# Patient Record
Sex: Male | Born: 1985
Health system: Southern US, Community
[De-identification: ages and names within clinical notes are randomized; demographics above are authoritative.]

---

## 2011-08-31 ENCOUNTER — Encounter (HOSPITAL_COMMUNITY): Payer: Self-pay | Admitting: *Deleted

## 2011-08-31 ENCOUNTER — Emergency Department (HOSPITAL_COMMUNITY)
Admission: EM | Admit: 2011-08-31 | Discharge: 2011-09-01 | Discharge status: Against Medical Advice | Payer: BC Managed Care – PPO | Attending: Emergency Medicine | Admitting: Emergency Medicine

## 2011-08-31 DIAGNOSIS — J392 Other diseases of pharynx: Secondary | ICD-10-CM

## 2011-08-31 DIAGNOSIS — R6889 Other general symptoms and signs: Secondary | ICD-10-CM | POA: Insufficient documentation

## 2011-08-31 NOTE — ED Notes (Signed)
The pt feels like there is something in the back of his throat.  It feels like a hair and he cannot get it out for 45 minutes

## 2011-09-01 MED ORDER — GI COCKTAIL ~~LOC~~
30.0000 mL | Freq: Once | ORAL | Status: DC
  Filled 2011-09-01: qty 30

## 2011-09-01 NOTE — ED Notes (Signed)
erprovider aware that patient has left the department

## 2011-09-01 NOTE — ED Notes (Signed)
Returned to give patient his medication,  He was no longer in his Actor.  The nurse out front reports seeing the patient and his friend walk out.  The patient did not talk to any staff before leaving.

## 2011-09-01 NOTE — ED Provider Notes (Signed)
Medical screening examination/treatment/procedure(s) were performed by non-physician practitioner and as supervising physician I was immediately available for consultation/collaboration.  Sunnie Nielsen, MD 09/01/11 [phone]

## 2011-09-01 NOTE — ED Provider Notes (Signed)
History     CSN: [id number]  Arrival date & time 08/31/11  2332   First MD Initiated Contact with Patient 09/01/11 0117      Chief Complaint  Patient presents with  . something on his throat     (Consider location/radiation/quality/duration/timing/severity/associated sxs/prior treatment) HPI Comments: Patient here with the sensation that he has something stuck in the right back of his throat - he thinks that he may have swallowed a hair and has been unable to get it out - denies eating something with bones or hard, reports no pain just irritation in the throat - no fever, chills, cough or congestion.  Patient is a 26 y.o. male presenting with foreign body. The history is provided by the patient. No language interpreter was used.  Foreign Body  The current episode started 1 to 2 hours ago. The foreign body is suspected to be swallowed. The foreign body is Osei unknown object. The incident was reported. The incident was witnessed/reported by the patient. Pertinent negatives include no chest pain, no fever, no abdominal pain, no vomiting, no congestion, no drainage, no drooling, no hearing loss, no nosebleeds, no sore throat, no trouble swallowing, no choking, no cough and no difficulty breathing. He has been behaving normally. His past medical history does not include prior foreign body removal, esophageal disease or pica. There were no sick contacts. He has received no recent medical care.    History reviewed. No pertinent past medical history.  History reviewed. No pertinent past surgical history.  No family history on file.  History  Substance Use Topics  . Smoking status: Never Smoker   . Smokeless tobacco: Not on file  . Alcohol Use: No      Review of Systems  Constitutional: Negative for fever.  HENT: Negative for nosebleeds, congestion, sore throat, drooling and trouble swallowing.   Respiratory: Negative for cough and choking.   Cardiovascular: Negative for chest pain.    Gastrointestinal: Negative for vomiting and abdominal pain.  All other systems reviewed and are negative.    Allergies  Review of patient's allergies indicates no known allergies.  Home Medications  No current outpatient prescriptions on file.  BP 126/81  Pulse 58  Temp(Src) 97.5 F (36.4 C) (Oral)  Resp 18  SpO2 98%  Physical Exam  Nursing note and vitals reviewed. Constitutional: He is oriented to person, place, and time. He appears well-developed and well-nourished. No distress.  HENT:  Head: Normocephalic and atraumatic.  Right Ear: External ear normal.  Left Ear: External ear normal.  Nose: Nose normal.  Mouth/Throat: Oropharynx is clear and moist. No oropharyngeal exudate.       No evidence of laceration or foreign body  Eyes: Conjunctivae are normal. Pupils are equal, round, and reactive to light. No scleral icterus.  Neck: Normal range of motion. Neck supple. No JVD present. No tracheal deviation present.  Cardiovascular: Normal rate, regular rhythm and normal heart sounds.  Exam reveals no gallop and no friction rub.   No murmur heard. Pulmonary/Chest: Effort normal and breath sounds normal. No stridor. No respiratory distress. He has no wheezes. He has no rales. He exhibits no tenderness.  Abdominal: Soft. Bowel sounds are normal. He exhibits no distension. There is no tenderness.  Musculoskeletal: Normal range of motion. He exhibits no edema and no tenderness.  Lymphadenopathy:    He has no cervical adenopathy.  Neurological: He is alert and oriented to person, place, and time. No cranial nerve deficit. He exhibits normal muscle tone.  Coordination normal.  Skin: Skin is warm and dry. No rash noted. No erythema. No pallor.  Psychiatric: He has a normal mood and affect. His behavior is normal. Judgment and thought content normal.    ED Course  Procedures (including critical care time)  Labs Reviewed - No data to display No results found.   Throat  irritation   MDM  Patient here with the sensation that there is something in his throat - this was not directly visualized with tongue depressor and light - I had planned to give the patient GI cocktail but was informed by the nurse that he left after seeing me.        Izola Price [place], [place] 09/01/11 [phone]

## 2012-01-20 ENCOUNTER — Ambulatory Visit: Payer: Self-pay | Admitting: Family Medicine

## 2012-01-20 LAB — DOT URINE DIP: Glucose,UR: NEGATIVE mg/dL (ref 0–75)

## 2013-07-06 ENCOUNTER — Ambulatory Visit: Payer: Self-pay | Admitting: Family Medicine

## 2014-02-18 ENCOUNTER — Ambulatory Visit: Payer: Self-pay | Admitting: Physician Assistant

## 2014-02-18 LAB — DOT URINE DIP
BLOOD: NEGATIVE
Glucose,UR: NEGATIVE
Protein: NEGATIVE
SPECIFIC GRAVITY: 1.025 (ref 1.000–1.030)

## 2014-07-08 ENCOUNTER — Ambulatory Visit
Admit: 2014-07-08 | Disposition: A | Discharge status: Home / Self Care | Payer: Self-pay | Attending: Family Medicine | Admitting: Family Medicine

## 2015-03-26 IMAGING — CR DG CHEST 1V
1 series · 1 of 1 positions shown · non-contrast
Comparison: None.

CLINICAL DATA: + ppd

EXAM:
CHEST - 1 VIEW

[w chest pa]
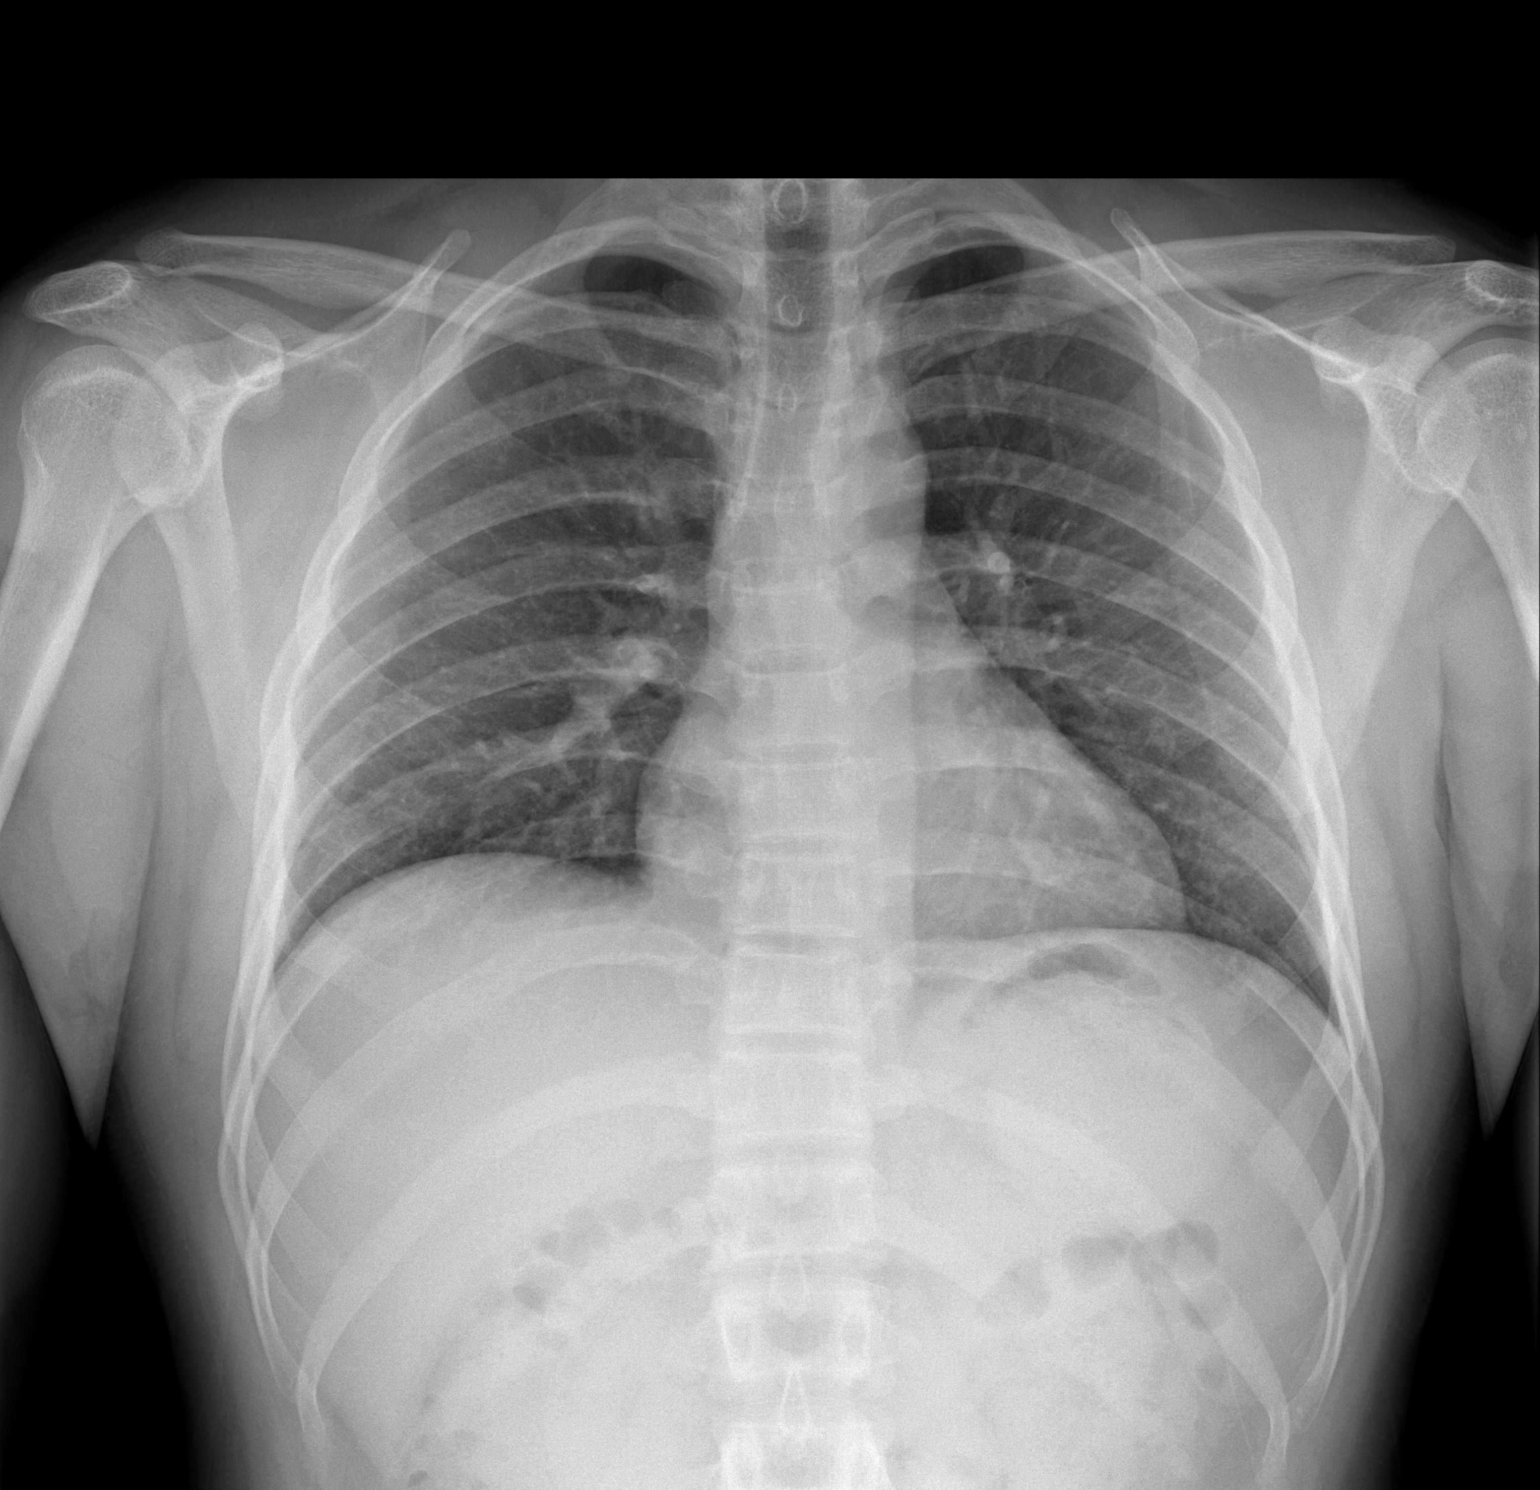

[1 of 1 positions shown; findings below may reference images not displayed]

FINDINGS: The heart size and mediastinal contours are within normal limits.
Both lungs are clear. The visualized skeletal structures are
unremarkable.
IMPRESSION: No active disease.

## 2016-02-03 ENCOUNTER — Encounter: Payer: Self-pay | Admitting: Unknown Physician Specialty

## 2016-02-03 ENCOUNTER — Ambulatory Visit (INDEPENDENT_AMBULATORY_CARE_PROVIDER_SITE_OTHER): Payer: Self-pay | Admitting: Unknown Physician Specialty

## 2016-02-03 VITALS — BP 139/86 | HR 61 | Temp 98.3°F | Ht 67.0 in | Wt 176.4 lb

## 2016-02-03 DIAGNOSIS — Z0000 Encounter for general adult medical examination without abnormal findings: Secondary | ICD-10-CM

## 2016-02-03 DIAGNOSIS — Z024 Encounter for examination for driving license: Secondary | ICD-10-CM

## 2016-02-03 LAB — URINALYSIS, DIPSTICK ONLY
BILIRUBIN UA: NEGATIVE
GLUCOSE, UA: NEGATIVE
Ketones, UA: NEGATIVE
Leukocytes, UA: NEGATIVE
Nitrite, UA: NEGATIVE
PROTEIN UA: NEGATIVE
RBC UA: NEGATIVE
SPEC GRAV UA: 1.025 (ref 1.005–1.030)
Urobilinogen, Ur: 0.2 mg/dL (ref 0.2–1.0)
pH, UA: 6 (ref 5.0–7.5)

## 2016-07-30 ENCOUNTER — Ambulatory Visit (HOSPITAL_COMMUNITY)
Admission: EM | Admit: 2016-07-30 | Discharge: 2016-07-30 | Disposition: A | Discharge status: Home / Self Care | Payer: Self-pay | Attending: Family Medicine | Admitting: Family Medicine

## 2016-07-30 ENCOUNTER — Encounter (HOSPITAL_COMMUNITY): Payer: Self-pay

## 2016-07-30 DIAGNOSIS — J301 Allergic rhinitis due to pollen: Secondary | ICD-10-CM

## 2016-07-30 MED ORDER — IPRATROPIUM BROMIDE 0.06 % NA SOLN: 2.0000 | Freq: Four times a day (QID) | NASAL | 0 refills | Status: DC

## 2016-07-30 MED ORDER — METHYLPREDNISOLONE 4 MG PO TBPK: ORAL_TABLET | ORAL | 0 refills | Status: DC

## 2016-07-30 NOTE — ED Triage Notes (Signed)
Sore throat, runny nose, itchy eyes, sneezing, watery eyes, hot spells for 3 weeks. Has been taking claritin, zaditor and sudafed congesion.

## 2016-07-30 NOTE — ED Provider Notes (Signed)
CSN: [id number]     Arrival date & time 07/30/16  1446 History   None    Chief Complaint  Patient presents with  . Nasal Congestion   (Consider location/radiation/quality/duration/timing/severity/associated sxs/prior Treatment) Patient c/o allergies    The history is provided by the patient.  Allergic Reaction  Severity:  Mild Relieved by:  Nothing Worsened by:  Nothing Ineffective treatments:  None tried URI  Presenting symptoms: congestion, fatigue and rhinorrhea   Severity:  Moderate Duration:  2 days Timing:  Constant Chronicity:  New Relieved by:  Nothing Worsened by:  Nothing Ineffective treatments:  None tried   History reviewed. No pertinent past medical history. History reviewed. No pertinent surgical history. No family history on file. Social History  Substance Use Topics  . Smoking status: Never Smoker  . Smokeless tobacco: Never Used  . Alcohol use No    Review of Systems  Constitutional: Positive for fatigue.  HENT: Positive for congestion and rhinorrhea.   Eyes: Negative.   Respiratory: Negative.   Cardiovascular: Negative.   Gastrointestinal: Negative.   Endocrine: Negative.   Genitourinary: Negative.   Musculoskeletal: Negative.   Allergic/Immunologic: Negative.   Neurological: Negative.   Hematological: Negative.   Psychiatric/Behavioral: Negative.     Allergies  Patient has no known allergies.  Home Medications   Prior to Admission medications   Medication Sig Start Date End Date Taking? Authorizing Provider  ipratropium (ATROVENT) 0.06 % nasal spray Place 2 sprays into both nostrils 4 (four) times daily. 07/30/16   Deatra CanterWilliam J Laquinton Bihm, FNP  methylPREDNISolone (MEDROL DOSEPAK) 4 MG TBPK tablet Take 6-5-4-3-2-1 po qd 07/30/16   Deatra CanterWilliam J Mikinzie Maciejewski, FNP   Meds Ordered and Administered this Visit  Medications - No data to display  BP 138/86 (BP Location: Left Arm)   Pulse 67   Temp 98.1 F (36.7 C) (Oral)   Resp 20   SpO2 98%  No data  found.   Physical Exam  Constitutional: He is oriented to person, place, and time. He appears well-developed and well-nourished.  HENT:  Head: Normocephalic and atraumatic.  Right Ear: External ear normal.  Left Ear: External ear normal.  Mouth/Throat: Oropharynx is clear and moist.  Eyes: Conjunctivae and EOM are normal. Pupils are equal, round, and reactive to light.  Neck: Normal range of motion. Neck supple.  Cardiovascular: Normal rate, regular rhythm and normal heart sounds.   Pulmonary/Chest: Effort normal and breath sounds normal.  Neurological: He is alert and oriented to person, place, and time.  Nursing note and vitals reviewed.   Urgent Care Course     Procedures (including critical care time)  Labs Review Labs Reviewed - No data to display  Imaging Review No results found.   Visual Acuity Review  Right Eye Distance:   Left Eye Distance:   Bilateral Distance:    Right Eye Near:   Left Eye Near:    Bilateral Near:         MDM   1. Seasonal allergic rhinitis due to pollen    Atrovent nasal spray Medrol dose pack as directed  Push po fluids, rest, tylenol and motrin otc prn as directed for fever, arthralgias, and myalgias.  Follow up prn if sx's continue or persist.    Deatra CanterWilliam J Jobani Sabado, FNP 07/30/16 1626

## 2016-09-23 NOTE — Progress Notes (Signed)
   BP 139/86 (BP Location: Left Arm, Patient Position: Sitting, Cuff Size: Large)   Pulse 61   Temp 98.3 F (36.8 C)   Ht 5\' 7"  (1.702 m)   Wt 176 lb 6.4 oz (80 kg)   SpO2 97%   BMI 27.63 kg/m    Subjective:    Patient ID: Billy Mcclain, male    DOB: 07/19/1985, 31 y.o.   MRN: [id number]  HPI: Billy Mcclain is a 31 y.o. male  Chief Complaint  Patient presents with  . DOT Physical   See form Relevant past medical, surgical, family and social history reviewed and updated as indicated. Interim medical history since our last visit reviewed. Allergies and medications reviewed and updated.  Review of Systems  Per HPI unless specifically indicated above     Objective:    BP 139/86 (BP Location: Left Arm, Patient Position: Sitting, Cuff Size: Large)   Pulse 61   Temp 98.3 F (36.8 C)   Ht 5\' 7"  (1.702 m)   Wt 176 lb 6.4 oz (80 kg)   SpO2 97%   BMI 27.63 kg/m   Wt Readings from Last 3 Encounters:  02/03/16 176 lb 6.4 oz (80 kg)    Physical Exam  Results for orders placed or performed in visit on 02/03/16  Urinalysis, dipstick only  Result Value Ref Range   Specific Gravity, UA 1.025 1.005 - 1.030   pH, UA 6.0 5.0 - 7.5   Color, UA Yellow Yellow   Appearance Ur Clear Clear   Leukocytes, UA Negative Negative   Protein, UA Negative Negative/Trace   Glucose, UA Negative Negative   Ketones, UA Negative Negative   RBC, UA Negative Negative   Bilirubin, UA Negative Negative   Urobilinogen, Ur 0.2 0.2 - 1.0 mg/dL   Nitrite, UA Negative Negative      Assessment & Plan:   Problem List Items Addressed This Visit    None    Visit Diagnoses    Routine general medical examination at a health care facility    -  Primary   Relevant Orders   Urinalysis, dipstick only (Completed)   Encounter for commercial driver medical examination (CDME)          See form  Follow up plan: No Follow-up on file.

## 2017-07-28 ENCOUNTER — Encounter (HOSPITAL_COMMUNITY): Payer: Self-pay | Admitting: Emergency Medicine

## 2017-07-28 ENCOUNTER — Other Ambulatory Visit: Payer: Self-pay

## 2017-07-28 ENCOUNTER — Ambulatory Visit (HOSPITAL_COMMUNITY)
Admission: EM | Admit: 2017-07-28 | Discharge: 2017-07-28 | Disposition: A | Discharge status: Home / Self Care | Payer: 59 | Attending: Family Medicine | Admitting: Family Medicine

## 2017-07-28 DIAGNOSIS — J302 Other seasonal allergic rhinitis: Secondary | ICD-10-CM

## 2017-07-28 MED ORDER — METHYLPREDNISOLONE ACETATE 80 MG/ML IJ SUSP
80.0000 mg | Freq: Once | INTRAMUSCULAR | Status: AC
  Administered 2017-07-28: 80 mg via INTRAMUSCULAR

## 2017-07-28 MED ORDER — METHYLPREDNISOLONE ACETATE 80 MG/ML IJ SUSP
INTRAMUSCULAR | Status: AC
  Filled 2017-07-28: qty 1

## 2017-07-28 NOTE — ED Triage Notes (Signed)
Sore throat in the morning, sneezing, sniffles, coughing.  Onset of symptoms was saturday

## 2017-07-28 NOTE — ED Provider Notes (Signed)
[facility]    CSN: [id number] Arrival date & time: 07/28/17  1821     History   Chief Complaint Chief Complaint  Patient presents with  . Allergies    HPI Billy Mcclain is a 32 y.o. male.   Seasonal symptoms of sneezing itchy eyes sore throat cough.  Daughter at home has strep throat but patient has not had any fever or headache.  Has not taken any OTC medicine  HPI  History reviewed. No pertinent past medical history.  There are no active problems to display for this patient.   History reviewed. No pertinent surgical history.     Home Medications    Prior to Admission medications   Medication Sig Start Date End Date Taking? Authorizing Provider  ipratropium (ATROVENT) 0.06 % nasal spray Place 2 sprays into both nostrils 4 (four) times daily. 07/30/16   Deatra Canter, FNP  methylPREDNISolone (MEDROL DOSEPAK) 4 MG TBPK tablet Take 6-5-4-3-2-1 po qd 07/30/16   Deatra Canter, FNP    Family History Family History  Problem Relation Age of Onset  . Diabetes Mother   . Hypertension Father     Social History Social History   Tobacco Use  . Smoking status: Never Smoker  . Smokeless tobacco: Never Used  Substance Use Topics  . Alcohol use: No  . Drug use: No     Allergies   Patient has no known allergies.   Review of Systems Review of Systems  Constitutional: Negative.   HENT: Positive for congestion, postnasal drip and sneezing.   Respiratory: Positive for cough.   Cardiovascular: Negative.      Physical Exam Triage Vital Signs ED Triage Vitals  Enc Vitals Group     BP 07/28/17 1857 (!) 140/92     Pulse Rate 07/28/17 1857 74     Resp 07/28/17 1857 18     Temp 07/28/17 1857 98.4 F (36.9 C)     Temp Source 07/28/17 1857 Oral     SpO2 07/28/17 1857 97 %     Weight --      Height --      Head Circumference --      Peak Flow --      Pain Score 07/28/17 1855 8     Pain Loc --      Pain Edu? --      Excl. in GC? --    No data  found.  Updated Vital Signs BP (!) 140/92 (BP Location: Left Arm)   Pulse 74   Temp 98.4 F (36.9 C) (Oral)   Resp 18   SpO2 97%   Visual Acuity Right Eye Distance:   Left Eye Distance:   Bilateral Distance:    Right Eye Near:   Left Eye Near:    Bilateral Near:     Physical Exam  Constitutional: He appears well-developed and well-nourished.  HENT:  Nose: Nose normal.  Mouth/Throat: Oropharynx is clear and moist.  Eyes: Pupils are equal, round, and reactive to light. Conjunctivae are normal.  Cardiovascular: Normal rate and regular rhythm.  Pulmonary/Chest: Effort normal and breath sounds normal.     UC Treatments / Results  Labs (all labs ordered are listed, but only abnormal results are displayed) Labs Reviewed - No data to display  EKG None  Radiology No results found.  Procedures Procedures (including critical care time)  Medications Ordered in UC Medications - No data to display  Initial Impression / Assessment and Plan / UC  Course  I have reviewed the triage vital signs and the nursing notes.  Pertinent labs & imaging results that were available during my care of the patient were reviewed by me and considered in my medical decision making (see chart for details).     Seasonal allergic allergic rhinitis.  We will plan combination antihistamine steroid nasal spray and steroid injection Final Clinical Impressions(s) / UC Diagnoses   Final diagnoses:  None   Discharge Instructions   None    ED Prescriptions    None     Controlled Substance Prescriptions [place] Controlled Substance Registry consulted? No   Frederica Kuster, MD 07/28/17 [phone]

## 2018-02-14 DIAGNOSIS — R030 Elevated blood-pressure reading, without diagnosis of hypertension: Secondary | ICD-10-CM | POA: Diagnosis not present

## 2018-02-14 DIAGNOSIS — E669 Obesity, unspecified: Secondary | ICD-10-CM | POA: Diagnosis not present

## 2018-02-14 DIAGNOSIS — M545 Low back pain: Secondary | ICD-10-CM | POA: Diagnosis not present

## 2018-06-15 DIAGNOSIS — M2560 Stiffness of unspecified joint, not elsewhere classified: Secondary | ICD-10-CM | POA: Diagnosis not present

## 2018-06-15 DIAGNOSIS — M545 Low back pain: Secondary | ICD-10-CM | POA: Diagnosis not present

## 2018-06-15 DIAGNOSIS — R293 Abnormal posture: Secondary | ICD-10-CM | POA: Diagnosis not present

## 2018-06-28 DIAGNOSIS — M545 Low back pain: Secondary | ICD-10-CM | POA: Diagnosis not present

## 2018-06-28 DIAGNOSIS — R293 Abnormal posture: Secondary | ICD-10-CM | POA: Diagnosis not present

## 2018-06-28 DIAGNOSIS — M2560 Stiffness of unspecified joint, not elsewhere classified: Secondary | ICD-10-CM | POA: Diagnosis not present

## 2018-06-29 DIAGNOSIS — R293 Abnormal posture: Secondary | ICD-10-CM | POA: Diagnosis not present

## 2018-06-29 DIAGNOSIS — M2560 Stiffness of unspecified joint, not elsewhere classified: Secondary | ICD-10-CM | POA: Diagnosis not present

## 2018-06-29 DIAGNOSIS — M545 Low back pain: Secondary | ICD-10-CM | POA: Diagnosis not present

## 2018-07-05 DIAGNOSIS — M2560 Stiffness of unspecified joint, not elsewhere classified: Secondary | ICD-10-CM | POA: Diagnosis not present

## 2018-07-05 DIAGNOSIS — M545 Low back pain: Secondary | ICD-10-CM | POA: Diagnosis not present

## 2018-07-05 DIAGNOSIS — R293 Abnormal posture: Secondary | ICD-10-CM | POA: Diagnosis not present

## 2018-07-06 DIAGNOSIS — M545 Low back pain: Secondary | ICD-10-CM | POA: Diagnosis not present

## 2018-07-06 DIAGNOSIS — M2560 Stiffness of unspecified joint, not elsewhere classified: Secondary | ICD-10-CM | POA: Diagnosis not present

## 2018-07-06 DIAGNOSIS — R293 Abnormal posture: Secondary | ICD-10-CM | POA: Diagnosis not present

## 2018-07-07 DIAGNOSIS — R293 Abnormal posture: Secondary | ICD-10-CM | POA: Diagnosis not present

## 2018-07-07 DIAGNOSIS — M545 Low back pain: Secondary | ICD-10-CM | POA: Diagnosis not present

## 2018-07-07 DIAGNOSIS — M2560 Stiffness of unspecified joint, not elsewhere classified: Secondary | ICD-10-CM | POA: Diagnosis not present

## 2018-07-10 DIAGNOSIS — R293 Abnormal posture: Secondary | ICD-10-CM | POA: Diagnosis not present

## 2018-07-10 DIAGNOSIS — M545 Low back pain: Secondary | ICD-10-CM | POA: Diagnosis not present

## 2018-07-10 DIAGNOSIS — M2560 Stiffness of unspecified joint, not elsewhere classified: Secondary | ICD-10-CM | POA: Diagnosis not present

## 2018-07-12 ENCOUNTER — Other Ambulatory Visit: Payer: Self-pay

## 2018-07-12 ENCOUNTER — Encounter (HOSPITAL_COMMUNITY): Payer: Self-pay | Admitting: Emergency Medicine

## 2018-07-12 ENCOUNTER — Emergency Department (HOSPITAL_COMMUNITY)
Admission: EM | Admit: 2018-07-12 | Discharge: 2018-07-12 | Disposition: A | Discharge status: Home / Self Care | Payer: 59 | Attending: Emergency Medicine | Admitting: Emergency Medicine

## 2018-07-12 ENCOUNTER — Ambulatory Visit (HOSPITAL_COMMUNITY)
Admission: EM | Admit: 2018-07-12 | Discharge: 2018-07-12 | Disposition: A | Discharge status: Home / Self Care | Payer: 59 | Source: Home / Self Care

## 2018-07-12 ENCOUNTER — Emergency Department (HOSPITAL_COMMUNITY): Payer: 59

## 2018-07-12 DIAGNOSIS — J069 Acute upper respiratory infection, unspecified: Secondary | ICD-10-CM | POA: Insufficient documentation

## 2018-07-12 DIAGNOSIS — Z20822 Contact with and (suspected) exposure to covid-19: Secondary | ICD-10-CM

## 2018-07-12 DIAGNOSIS — Z79899 Other long term (current) drug therapy: Secondary | ICD-10-CM | POA: Insufficient documentation

## 2018-07-12 DIAGNOSIS — R509 Fever, unspecified: Secondary | ICD-10-CM | POA: Diagnosis present

## 2018-07-12 DIAGNOSIS — Z03818 Encounter for observation for suspected exposure to other biological agents ruled out: Secondary | ICD-10-CM | POA: Diagnosis not present

## 2018-07-12 DIAGNOSIS — Z20828 Contact with and (suspected) exposure to other viral communicable diseases: Secondary | ICD-10-CM | POA: Insufficient documentation

## 2018-07-12 DIAGNOSIS — R6889 Other general symptoms and signs: Secondary | ICD-10-CM

## 2018-07-12 DIAGNOSIS — R05 Cough: Secondary | ICD-10-CM | POA: Diagnosis not present

## 2018-07-12 MED ORDER — ALBUTEROL SULFATE HFA 108 (90 BASE) MCG/ACT IN AERS
2.0000 | INHALATION_SPRAY | Freq: Once | RESPIRATORY_TRACT | Status: AC
  Administered 2018-07-12: 2 via RESPIRATORY_TRACT
  Filled 2018-07-12: qty 6.7

## 2018-07-12 MED ORDER — ACETAMINOPHEN 325 MG PO TABS
650.0000 mg | ORAL_TABLET | Freq: Once | ORAL | Status: AC
  Administered 2018-07-12: 650 mg via ORAL
  Filled 2018-07-12: qty 2

## 2018-07-12 MED ORDER — AEROCHAMBER Z-STAT PLUS/MEDIUM MISC
1.0000 | Freq: Once | Status: AC
  Administered 2018-07-12: 1
  Filled 2018-07-12: qty 1

## 2018-07-12 NOTE — Discharge Instructions (Addendum)
Your chest x-ray is clear.  Your symptoms are likely due to a virus, this may be caused by the coronavirus.  Please continue to treat your symptoms supportively at home, you may take Tylenol every 6 hours for fever and pain, you may use albuterol inhaler as needed for shortness of breath, over-the-counter medication such as Zyrtec or Flonase to help with nasal congestion.  Please make sure you are drinking plenty of fluids and getting rest.  It is recommended that you stay home and quarantine yourself, avoid going into public or exposing any of your other family members.  Return to the emergency department, you you have worsening shortness of breath or difficulty breathing.

## 2018-07-12 NOTE — ED Provider Notes (Signed)
[facility]-EMERGENCY DEPT Provider Note   CSN: [id number] Arrival date & time: 07/12/18  1621    History   Chief Complaint Chief Complaint  Patient presents with  . Fever  . Generalized Body Aches  . Chills  . Shortness of Breath    HPI Billy Mcclain is a 33 y.o. male.     Billy Mcclain is a 33 y.o. male who is otherwise healthy, presents to the emergency department for evaluation of 3 to 4 days of persistent fevers, chills, body aches.  Today he also developed Aubert intermittent cough and started feeling short of breath when he would lay down.  He denies any associated chest pain.  He has had some mild nasal congestion, denies sore throat.  He denies any abdominal pain, nausea vomiting or diarrhea.  Denies any known sick contacts or recent travel.  Reports he has been staying home primarily aside from going out to get supplies intermittently.  He has not taken anything for his symptoms aside from 1 dose of ibuprofen yesterday, no other aggravating or alleviating factors.     History reviewed. No pertinent past medical history.  There are no active problems to display for this patient.   History reviewed. No pertinent surgical history.      Home Medications    Prior to Admission medications   Medication Sig Start Date End Date Taking? Authorizing Provider  ipratropium (ATROVENT) 0.06 % nasal spray Place 2 sprays into both nostrils 4 (four) times daily. 07/30/16   Deatra Canterxford, William J, FNP  methylPREDNISolone (MEDROL DOSEPAK) 4 MG TBPK tablet Take 6-5-4-3-2-1 po qd 07/30/16   Deatra Canterxford, William J, FNP    Family History Family History  Problem Relation Age of Onset  . Diabetes Mother   . Hypertension Father     Social History Social History   Tobacco Use  . Smoking status: Never Smoker  . Smokeless tobacco: Never Used  Substance Use Topics  . Alcohol use: No  . Drug use: No     Allergies   Patient has no known allergies.   Review of Systems  Review of Systems   Physical Exam Updated Vital Signs BP (!) 143/102 (BP Location: Left Arm)   Pulse (!) 111   Temp (!) 102.2 F (39 C) (Oral)   Resp 18   SpO2 99%   Physical Exam Vitals signs and nursing note reviewed.  Constitutional:      General: He is not in acute distress.    Appearance: Normal appearance. He is well-developed and normal weight. He is not ill-appearing, toxic-appearing or diaphoretic.  HENT:     Head: Normocephalic and atraumatic.     Nose:     Comments: Bilateral nares patent with moderate mucosal edema and clear rhinorrhea present.     Mouth/Throat:     Mouth: Mucous membranes are moist.     Pharynx: Oropharynx is clear.     Comments: Posterior oropharynx clear and mucous membranes moist, there is mild erythema but no edema or tonsillar exudates, uvula midline, normal phonation, no trismus, tolerating secretions without difficulty. Eyes:     General:        Right eye: No discharge.        Left eye: No discharge.  Neck:     Musculoskeletal: Neck supple.  Cardiovascular:     Rate and Rhythm: Regular rhythm. Tachycardia present.     Heart sounds: Normal heart sounds. No murmur. No friction rub. No gallop.   Pulmonary:  Effort: Pulmonary effort is normal. No respiratory distress.     Breath sounds: Normal breath sounds.     Comments: Respirations equal and unlabored, patient able to speak in full sentences, lungs clear to auscultation bilaterally, slightly diminished lung sounds in the bases Abdominal:     General: Abdomen is flat. Bowel sounds are normal. There is no distension.     Palpations: Abdomen is soft. There is no mass.     Tenderness: There is no abdominal tenderness. There is no guarding.     Comments: Abdomen soft, nondistended, nontender to palpation in all quadrants without guarding or peritoneal signs  Skin:    General: Skin is warm and dry.  Neurological:     Mental Status: He is alert and oriented to person, place, and time.      Coordination: Coordination normal.  Psychiatric:        Mood and Affect: Mood normal.        Behavior: Behavior normal.      ED Treatments / Results  Labs (all labs ordered are listed, but only abnormal results are displayed) Labs Reviewed - No data to display  EKG None  Radiology Dg Chest [facility] 1 View  Result Date: 07/12/2018 CLINICAL DATA:  Cough, fever EXAM: PORTABLE CHEST 1 VIEW COMPARISON:  None. FINDINGS: The heart size and mediastinal contours are within normal limits. Both lungs are clear. The visualized skeletal structures are unremarkable. IMPRESSION: No active disease. Electronically Signed   By: Elige Ko   On: 07/12/2018 18:26    Procedures Procedures (including critical care time)  Medications Ordered in ED Medications  acetaminophen (TYLENOL) tablet 650 mg (650 mg Oral Given 07/12/18 1825)  albuterol (PROVENTIL HFA;VENTOLIN HFA) 108 (90 Base) MCG/ACT inhaler 2 puff (2 puffs Inhalation Given 07/12/18 1826)  aerochamber Z-Stat Plus/medium 1 each (1 each Other Given 07/12/18 1826)     Initial Impression / Assessment and Plan / ED Course  I have reviewed the triage vital signs and the nursing notes.  Pertinent labs & imaging results that were available during my care of the patient were reviewed by me and considered in my medical decision making (see chart for details).  33 year old male presents with 3 days of fevers, chills, body aches, occasional cough and shortness of breath.  Symptoms worsening today.  On arrival patient is febrile and tachycardic but vitals otherwise stable.  Breathing comfortably on room air.  Lungs are clear to auscultation, are a bit diminished particularly in bilateral bases.  No known sick contacts or recent travel.  In setting of current COVID-19 pandemic suspect a viral cause and must consider this is possibility.  Portable chest x-ray shows no evidence of pneumonia or other active cardiopulmonary disease.  Symptoms have improved with  treatment here in the ED with Tylenol and albuterol inhaler.  This with patient that testing is limited but self quarantine at home is still recommended.  Discussed appropriate supportive care at home.  Strict return precautions provided.  Patient expresses understanding and agreement with plan.  Discharged home in good condition.  Gina MOMIN MISKO was evaluated in Emergency Department on 07/12/2018 for the symptoms described in the history of present illness. He was evaluated in the context of the global COVID-19 pandemic, which necessitated consideration that the patient might be at risk for infection with the SARS-CoV-2 virus that causes COVID-19. Institutional protocols and algorithms that pertain to the evaluation of patients at risk for COVID-19 are in a state of rapid change based on  information released by regulatory bodies including the CDC and federal and state organizations. These policies and algorithms were followed during the patient's care in the ED.  Final Clinical Impressions(s) / ED Diagnoses   Final diagnoses:  Upper respiratory tract infection, unspecified type  Suspected Covid-19 Virus Infection    ED Discharge Orders    None       Legrand Rams 07/12/18 1944    Wynetta Fines, MD 07/12/18 2318

## 2018-07-12 NOTE — ED Triage Notes (Signed)
Pt reports since this weekend had body aches and SOB when lays down. reports last night started feeling feverish and chills even under blankets. Reports took Aleve last night but none today.  Denies travel or being around others who have been sick.

## 2018-09-20 ENCOUNTER — Other Ambulatory Visit: Payer: Self-pay | Admitting: Pediatric Intensive Care

## 2018-09-20 DIAGNOSIS — Z20822 Contact with and (suspected) exposure to covid-19: Secondary | ICD-10-CM

## 2018-09-24 LAB — NOVEL CORONAVIRUS, NAA: SARS-CoV-2, NAA: NOT DETECTED

## 2019-08-02 ENCOUNTER — Ambulatory Visit: Payer: 59 | Attending: Internal Medicine

## 2019-08-02 DIAGNOSIS — Z23 Encounter for immunization: Secondary | ICD-10-CM

## 2019-08-02 NOTE — Progress Notes (Signed)
   Covid-19 Vaccination Clinic  Name:  Billy Mcclain    MRN: [id number] DOB: 1985/07/02  08/02/2019  [name] was observed post Covid-19 immunization for 15 minutes without incident. He was provided with Vaccine Information Sheet and instruction to access the V-Safe system.   [name] was instructed to call 911 with any severe reactions post vaccine: Marland Kitchen Difficulty breathing  . Swelling of face and throat  . A fast heartbeat  . A bad rash all over body  . Dizziness and weakness   Immunizations Administered    Name Date Dose VIS Date Route   Pfizer COVID-19 Vaccine 08/02/2019  4:47 PM 0.3 mL 05/23/2018 Intramuscular   Manufacturer: [employer], [employer]   Lot: [id number]   NDC: [address]-2

## 2019-08-28 ENCOUNTER — Ambulatory Visit: Payer: 59 | Attending: Internal Medicine

## 2019-08-28 DIAGNOSIS — Z23 Encounter for immunization: Secondary | ICD-10-CM

## 2019-08-28 NOTE — Progress Notes (Signed)
   Covid-19 Vaccination Clinic  Name:  Billy Mcclain    MRN: [id number] DOB: Dec 03, 1985  08/28/2019  Mr. [name] was observed post Covid-19 immunization for 15 minutes without incident. He was provided with Vaccine Information Sheet and instruction to access the V-Safe system.   Mr. [name] was instructed to call 911 with any severe reactions post vaccine: Marland Kitchen Difficulty breathing  . Swelling of face and throat  . A fast heartbeat  . A bad rash all over body  . Dizziness and weakness   Immunizations Administered    Name Date Dose VIS Date Route   Pfizer COVID-19 Vaccine 08/28/2019  4:57 PM 0.3 mL 05/23/2018 Intramuscular   Manufacturer: [employer], [employer]   Lot: [id number]   NDC: [address]-0      Covid-19 Vaccination Clinic  Name:  Billy Mcclain    MRN: [id number] DOB: Aug 18, 1985  08/28/2019  Mr. [name] was observed post Covid-19 immunization for 15 minutes without incident. He was provided with Vaccine Information Sheet and instruction to access the V-Safe system.   Mr. [name] was instructed to call 911 with any severe reactions post vaccine: Marland Kitchen Difficulty breathing  . Swelling of face and throat  . A fast heartbeat  . A bad rash all over body  . Dizziness and weakness   Immunizations Administered    Name Date Dose VIS Date Route   Pfizer COVID-19 Vaccine 08/28/2019  4:57 PM 0.3 mL 05/23/2018 Intramuscular   Manufacturer: [employer], [employer]   Lot: [id number]   NDC: [address]-7

## 2020-03-29 ENCOUNTER — Ambulatory Visit (HOSPITAL_COMMUNITY)
Admission: EM | Admit: 2020-03-29 | Discharge: 2020-03-29 | Disposition: A | Discharge status: Home / Self Care | Payer: 59 | Attending: Student | Admitting: Student

## 2020-03-29 ENCOUNTER — Encounter (HOSPITAL_COMMUNITY): Payer: Self-pay | Admitting: Emergency Medicine

## 2020-03-29 ENCOUNTER — Other Ambulatory Visit: Payer: Self-pay

## 2020-03-29 DIAGNOSIS — J069 Acute upper respiratory infection, unspecified: Secondary | ICD-10-CM | POA: Insufficient documentation

## 2020-03-29 DIAGNOSIS — U071 COVID-19: Secondary | ICD-10-CM | POA: Diagnosis not present

## 2020-03-29 LAB — RESP PANEL BY RT-PCR (FLU A&B, COVID) ARPGX2
Influenza A by PCR: NEGATIVE
Influenza B by PCR: NEGATIVE
SARS Coronavirus 2 by RT PCR: POSITIVE — AB

## 2020-03-29 MED ORDER — ONDANSETRON HCL 8 MG PO TABS: 8.0000 mg | ORAL_TABLET | Freq: Three times a day (TID) | ORAL | 0 refills | Status: AC | PRN

## 2020-03-29 MED ORDER — PROMETHAZINE-DM 6.25-15 MG/5ML PO SYRP: 5.0000 mL | ORAL_SOLUTION | Freq: Four times a day (QID) | ORAL | 0 refills | Status: AC | PRN

## 2020-03-29 MED ORDER — LIDOCAINE VISCOUS HCL 2 % MT SOLN: 15.0000 mL | OROMUCOSAL | 0 refills | Status: AC | PRN

## 2020-03-29 MED ORDER — BENZONATATE 100 MG PO CAPS: 100.0000 mg | ORAL_CAPSULE | Freq: Three times a day (TID) | ORAL | 0 refills | Status: AC

## 2020-03-29 NOTE — ED Triage Notes (Signed)
PT C/O: cold sx onset 1 week associated w/sore throat, dysphagia, cough, fever, vomiting diarrhea  TAKING MEDS: none  A&O x4... NAD... Ambulatory

## 2020-03-29 NOTE — ED Provider Notes (Signed)
[facility]    CSN: [id number] Arrival date & time: 03/29/20  1552      History   Chief Complaint Chief Complaint  Patient presents with   URI    HPI Billy Mcclain is a 35 y.o. male Presenting for URI symptoms for 1 week. Sore throat 7/10 pain, cough, fever, vomiting, diarrhea. Adamant that his symptoms are not controlled on tylenol and ibuprofen and that he requires prescription management. States he's had 2 episodes of vomiting and 2 episodes of diarrhea. Denies abd pain. Deniesshortness of breath, chest pain, ,, facial pain, teeth pain, headaches, sore throat, loss of taste/smell, swollen lymph nodes, ear pain.  Denies chest pain, shortness of breath, confusion, high fevers.  Fully vaccinated for covid-19.   HPI  History reviewed. No pertinent past medical history.  There are no problems to display for this patient.   History reviewed. No pertinent surgical history.     Home Medications    Prior to Admission medications   Medication Sig Start Date End Date Taking? Authorizing Provider  benzonatate (TESSALON) 100 MG capsule Take 1 capsule (100 mg total) by mouth every 8 (eight) hours. 03/29/20  Yes Rhys Martini, PA-C  lidocaine (XYLOCAINE) 2 % solution Use as directed 15 mLs in the mouth or throat as needed for mouth pain. 03/29/20  Yes Rhys Martini, PA-C  ondansetron (ZOFRAN) 8 MG tablet Take 1 tablet (8 mg total) by mouth every 8 (eight) hours as needed for nausea or vomiting. 03/29/20  Yes Rhys Martini, PA-C  promethazine-dextromethorphan (PROMETHAZINE-DM) 6.25-15 MG/5ML syrup Take 5 mLs by mouth 4 (four) times daily as needed for cough. 03/29/20  Yes Rhys Martini, PA-C  naproxen sodium (ALEVE) 220 MG tablet Take 220 mg by mouth 2 (two) times daily as needed (pain).    [provider]    Family History Family History  Problem Relation Age of Onset   Diabetes Mother    Hypertension Father     Social History Social History    Tobacco Use   Smoking status: Never Smoker   Smokeless tobacco: Never Used  Substance Use Topics   Alcohol use: No   Drug use: No     Allergies   Patient has no known allergies.   Review of Systems Review of Systems  Constitutional: Positive for fever. Negative for appetite change and chills.  HENT: Positive for sore throat. Negative for congestion, ear pain, rhinorrhea, sinus pressure and sinus pain.   Eyes: Negative for redness and visual disturbance.  Respiratory: Positive for cough. Negative for chest tightness, shortness of breath and wheezing.   Cardiovascular: Negative for chest pain and palpitations.  Gastrointestinal: Positive for diarrhea, nausea and vomiting. Negative for abdominal pain and constipation.  Genitourinary: Negative for dysuria, frequency and urgency.  Musculoskeletal: Negative for myalgias.  Neurological: Negative for dizziness, weakness and headaches.  Psychiatric/Behavioral: Negative for confusion.  All other systems reviewed and are negative.    Physical Exam Triage Vital Signs ED Triage Vitals  Enc Vitals Group     BP 03/29/20 1739 139/90     Pulse Rate 03/29/20 1739 94     Resp 03/29/20 1739 18     Temp 03/29/20 1739 99.4 F (37.4 C)     Temp Source 03/29/20 1739 Oral     SpO2 03/29/20 1739 99 %     Weight --      Height --      Head Circumference --  Peak Flow --      Pain Score 03/29/20 1736 10     Pain Loc --      Pain Edu? --      Excl. in [place]? --    No data found.  Updated Vital Signs BP 139/90 (BP Location: Right Arm)    Pulse 94    Temp 99.4 F (37.4 C) (Oral)    Resp 18    SpO2 99%   Visual Acuity Right Eye Distance:   Left Eye Distance:   Bilateral Distance:    Right Eye Near:   Left Eye Near:    Bilateral Near:     Physical Exam Vitals reviewed.  Constitutional:      General: He is not in acute distress.    Appearance: Normal appearance. He is not ill-appearing.  HENT:     Head: Normocephalic and  atraumatic.     Right Ear: Hearing, tympanic membrane, ear canal and external ear normal. No swelling or tenderness. There is no impacted cerumen. No mastoid tenderness. Tympanic membrane is not perforated, erythematous, retracted or bulging.     Left Ear: Hearing, tympanic membrane, ear canal and external ear normal. No swelling or tenderness. There is no impacted cerumen. No mastoid tenderness. Tympanic membrane is not perforated, erythematous, retracted or bulging.     Nose:     Right Sinus: No maxillary sinus tenderness or frontal sinus tenderness.     Left Sinus: No maxillary sinus tenderness or frontal sinus tenderness.     Mouth/Throat:     Mouth: Mucous membranes are moist.     Pharynx: Uvula midline. Posterior oropharyngeal erythema present. No oropharyngeal exudate.     Tonsils: No tonsillar exudate.  Cardiovascular:     Rate and Rhythm: Normal rate and regular rhythm.     Heart sounds: Normal heart sounds.  Pulmonary:     Breath sounds: Normal breath sounds and air entry. No wheezing, rhonchi or rales.  Chest:     Chest wall: No tenderness.  Abdominal:     General: Abdomen is flat. Bowel sounds are normal.     Tenderness: There is no abdominal tenderness. There is no guarding or rebound.  Lymphadenopathy:     Cervical: No cervical adenopathy.  Neurological:     General: No focal deficit present.     Mental Status: He is alert and oriented to person, place, and time.  Psychiatric:        Attention and Perception: Attention and perception normal.        Mood and Affect: Mood and affect normal.        Behavior: Behavior normal. Behavior is cooperative.        Thought Content: Thought content normal.        Judgment: Judgment normal.      UC Treatments / Results  Labs (all labs ordered are listed, but only abnormal results are displayed) Labs Reviewed  RESP PANEL BY RT-PCR (FLU A&B, COVID) ARPGX2 - Abnormal; Notable for the following components:      Result Value    SARS Coronavirus 2 by RT PCR POSITIVE (*)    All other components within normal limits    EKG   Radiology No results found.  Procedures Procedures (including critical care time)  Medications Ordered in UC Medications - No data to display  Initial Impression / Assessment and Plan / UC Course  I have reviewed the triage vital signs and the nursing notes.  Pertinent labs & imaging results  that were available during my care of the patient were reviewed by me and considered in my medical decision making (see chart for details).      afebrile nontachycardic nontachypneic, oxygenating well on room air.  Covid and influenza tests sent today. Patient is fully vaccinated for covid-19. Isolation precautions per CDC guidelines until negative result. Symptomatic relief with OTC Mucinex, Nyquil, etc. Return precautions- new/worsening fevers/chills, shortness of breath, chest pain, abd pain, etc.   Tessalon, promethazine DM. Lidocaine mouthwash. Zofran.  rec good hydration, BRAT diet.  Return precautions- chest pain, shortness of breath, new/worsening fevers/chills, confusion, worsening of symptoms despite the above treatment plan, etc.   Final Clinical Impressions(s) / UC Diagnoses   Final diagnoses:  Viral upper respiratory tract infection   Discharge Instructions   None    ED Prescriptions    Medication Sig Dispense Auth. Provider   benzonatate (TESSALON) 100 MG capsule Take 1 capsule (100 mg total) by mouth every 8 (eight) hours. 21 capsule Rhys Martini, PA-C   promethazine-dextromethorphan (PROMETHAZINE-DM) 6.25-15 MG/5ML syrup Take 5 mLs by mouth 4 (four) times daily as needed for cough. 118 mL Ignacia Bayley E, PA-C   lidocaine (XYLOCAINE) 2 % solution Use as directed 15 mLs in the mouth or throat as needed for mouth pain. 100 mL Ignacia Bayley E, PA-C   ondansetron (ZOFRAN) 8 MG tablet Take 1 tablet (8 mg total) by mouth every 8 (eight) hours as needed for nausea or vomiting. 20  tablet Rhys Martini, PA-C     PDMP not reviewed this encounter.   Rhys Martini, PA-C 03/30/20 [phone]

## 2020-03-31 IMAGING — DX PORTABLE CHEST - 1 VIEW
1 series · 1 of 1 positions shown · non-contrast
Comparison: None.

CLINICAL DATA: Cough, fever

EXAM:
PORTABLE CHEST 1 VIEW

[chest ap]
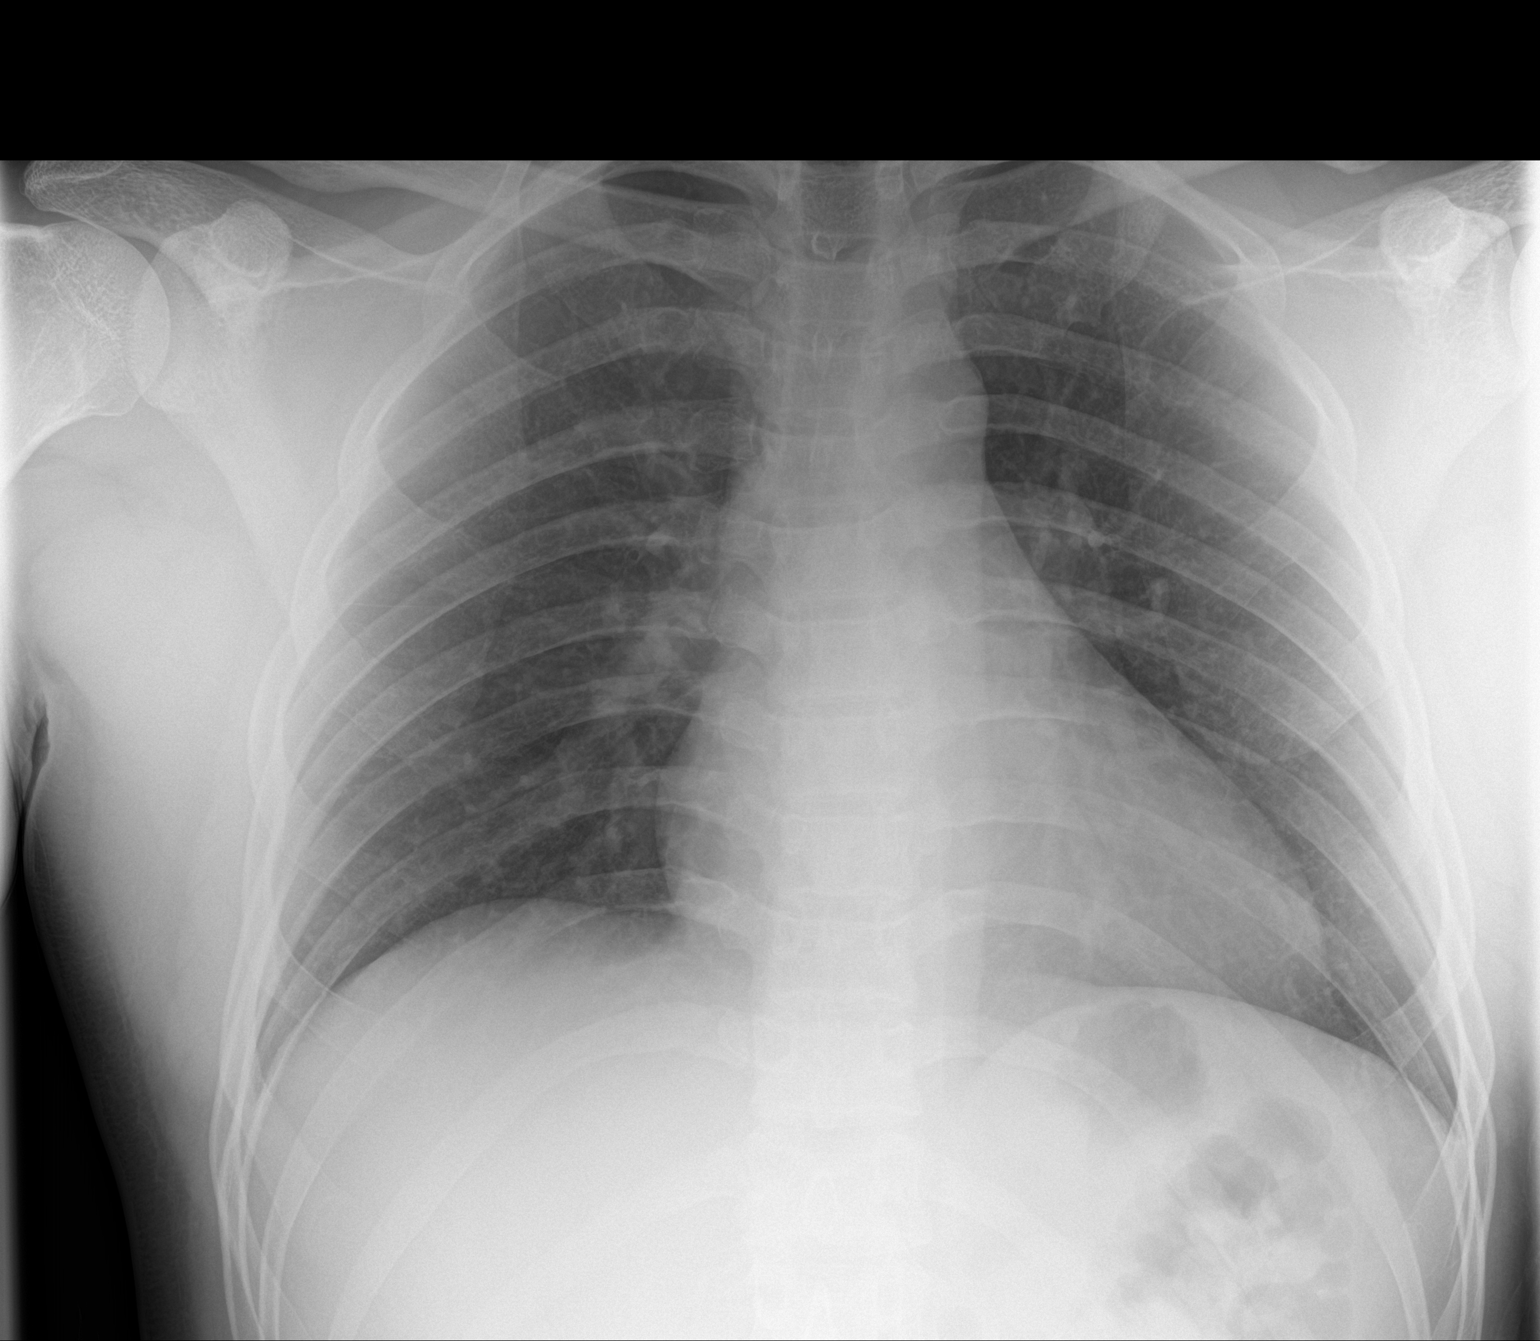

[1 of 1 positions shown; findings below may reference images not displayed]

FINDINGS: The heart size and mediastinal contours are within normal limits.
Both lungs are clear. The visualized skeletal structures are
unremarkable.
IMPRESSION: No active disease.
